# Patient Record
Sex: Female | Born: 1960 | Race: Black or African American | Hispanic: No | Marital: Married | Smoking: Never smoker
Health system: Southern US, Community
[De-identification: ages and names within clinical notes are randomized; demographics above are authoritative.]

## PROBLEM LIST (undated history)

## (undated) DIAGNOSIS — E876 Hypokalemia: Secondary | ICD-10-CM

## (undated) DIAGNOSIS — I10 Essential (primary) hypertension: Secondary | ICD-10-CM

## (undated) HISTORY — PX: ABDOMINAL HYSTERECTOMY: SHX81

## (undated) HISTORY — PX: TUBAL LIGATION: SHX77

## (undated) HISTORY — PX: APPENDECTOMY: SHX54

---

## 1998-08-20 DIAGNOSIS — I10 Essential (primary) hypertension: Secondary | ICD-10-CM

## 1998-08-20 HISTORY — DX: Essential (primary) hypertension: I10

## 2010-03-17 ENCOUNTER — Ambulatory Visit: Payer: Self-pay | Admitting: Diagnostic Radiology

## 2010-03-17 ENCOUNTER — Ambulatory Visit (HOSPITAL_BASED_OUTPATIENT_CLINIC_OR_DEPARTMENT_OTHER): Admission: RE | Admit: 2010-03-17 | Discharge: 2010-03-17 | Payer: Self-pay | Admitting: Unknown Physician Specialty

## 2011-05-21 ENCOUNTER — Emergency Department (HOSPITAL_BASED_OUTPATIENT_CLINIC_OR_DEPARTMENT_OTHER)
Admission: EM | Admit: 2011-05-21 | Discharge: 2011-05-21 | Disposition: A | Discharge status: Home / Self Care | Payer: Self-pay | Attending: Emergency Medicine | Admitting: Emergency Medicine

## 2011-05-21 DIAGNOSIS — E119 Type 2 diabetes mellitus without complications: Secondary | ICD-10-CM | POA: Insufficient documentation

## 2011-05-21 DIAGNOSIS — M25562 Pain in left knee: Secondary | ICD-10-CM

## 2011-05-21 DIAGNOSIS — M25569 Pain in unspecified knee: Secondary | ICD-10-CM | POA: Insufficient documentation

## 2011-05-21 DIAGNOSIS — I10 Essential (primary) hypertension: Secondary | ICD-10-CM | POA: Insufficient documentation

## 2011-05-21 HISTORY — DX: Essential (primary) hypertension: I10

## 2011-05-21 MED ORDER — HYDROCODONE-ACETAMINOPHEN 5-325 MG PO TABS: 2.0000 | ORAL_TABLET | ORAL | Status: AC | PRN

## 2011-05-21 MED ORDER — HYDROCODONE-ACETAMINOPHEN 5-325 MG PO TABS
2.0000 | ORAL_TABLET | Freq: Once | ORAL | Status: AC
  Administered 2011-05-21: 2 via ORAL
  Filled 2011-05-21: qty 2

## 2011-05-21 NOTE — ED Notes (Signed)
C/O anterior left knee pain

## 2011-05-21 NOTE — ED Provider Notes (Addendum)
History     CSN: [id number] Arrival date & time: 05/21/2011  9:56 AM  Chief Complaint  Patient presents with  . Knee Pain    (Consider location/radiation/quality/duration/timing/severity/associated sxs/prior treatment) HPI Complains of left knee pain anterior nonradiating onset 2 days ago quality sharp worse with movement improved with lying still no fever no injury no other associated symptoms no treatment prior to coming here no other complaint Past Medical History  Diagnosis Date  . Hypertension   . Diabetes mellitus    Arthritis Past Surgical History  Procedure Date  . Tubal ligation   . Appendectomy   . Cesarean section     History reviewed. No pertinent family history.  History  Substance Use Topics  . Smoking status: Never Smoker   . Smokeless tobacco: Never Used  . Alcohol Use: No    OB History    Grav Para Term Preterm Abortions TAB SAB Ect Mult Living                  Review of Systems  Constitutional: Negative.   Cardiovascular: Negative.   Gastrointestinal: Negative.   Musculoskeletal: Positive for arthralgias.  Skin: Negative.   Neurological: Negative.   Hematological: Negative.   Psychiatric/Behavioral: Negative.     Allergies  Anaprox  Home Medications   Current Outpatient Rx  Name Route Sig Dispense Refill  . HYDROCHLOROTHIAZIDE 25 MG PO TABS Oral Take 25 mg by mouth daily.      Marland Kitchen LISINOPRIL 20 MG PO TABS Oral Take 20 mg by mouth daily.      Marland Kitchen METFORMIN HCL 500 MG (MOD) PO TB24 Oral Take 500 mg by mouth daily with breakfast.        BP 173/100  Pulse 84  Temp(Src) 99.3 F (37.4 C) (Oral)  Resp 16  Ht 5\' 4"  (1.626 m)  Wt 190 lb (86.183 kg)  BMI 32.61 kg/m2  SpO2 98%  Physical Exam  Nursing note and vitals reviewed. Constitutional: She appears well-developed and well-nourished.  HENT:  Head: Normocephalic and atraumatic.  Eyes: Conjunctivae are normal. Pupils are equal, round, and reactive to light.  Neck: Neck supple. No  tracheal deviation present. No thyromegaly present.  Cardiovascular: Normal rate and regular rhythm.   No murmur heard. Pulmonary/Chest: Effort normal and breath sounds normal.  Abdominal:       Obese  Musculoskeletal: Normal range of motion. She exhibits no edema and no tenderness.       Left lower extremity no redness no swelling no point tenderness no joint effusion and knee DP pulse 2+ good capillary refill  Neurological: She is alert. Coordination normal.       Walks with slight limp favoring left lower extremity  Skin: Skin is warm and dry. No rash noted. No erythema.  Psychiatric: She has a normal mood and affect.    ED Course  Procedures (including critical care time)  Labs Reviewed - No data to display No results found.   No diagnosis found.    MDM  No further workup needed patient reports she took her blood glucose this morning which was 110. X-rays not indicated there is no signs of infection on physical exam Knee pain most likely secondary to arthritic condition Plan prescription Norco followup community clinic as needed Diagnosis pain in left knee        Doug Sou, MD 05/21/11 1046  Doug Sou, MD 05/21/11 1047

## 2011-06-13 ENCOUNTER — Emergency Department (INDEPENDENT_AMBULATORY_CARE_PROVIDER_SITE_OTHER): Payer: Self-pay

## 2011-06-13 ENCOUNTER — Encounter (HOSPITAL_BASED_OUTPATIENT_CLINIC_OR_DEPARTMENT_OTHER): Payer: Self-pay

## 2011-06-13 ENCOUNTER — Emergency Department (HOSPITAL_BASED_OUTPATIENT_CLINIC_OR_DEPARTMENT_OTHER)
Admission: EM | Admit: 2011-06-13 | Discharge: 2011-06-13 | Disposition: A | Discharge status: Home / Self Care | Payer: Self-pay | Attending: Emergency Medicine | Admitting: Emergency Medicine

## 2011-06-13 DIAGNOSIS — E119 Type 2 diabetes mellitus without complications: Secondary | ICD-10-CM | POA: Insufficient documentation

## 2011-06-13 DIAGNOSIS — J069 Acute upper respiratory infection, unspecified: Secondary | ICD-10-CM | POA: Insufficient documentation

## 2011-06-13 DIAGNOSIS — I10 Essential (primary) hypertension: Secondary | ICD-10-CM | POA: Insufficient documentation

## 2011-06-13 DIAGNOSIS — R0989 Other specified symptoms and signs involving the circulatory and respiratory systems: Secondary | ICD-10-CM

## 2011-06-13 DIAGNOSIS — R05 Cough: Secondary | ICD-10-CM

## 2011-06-13 NOTE — ED Notes (Signed)
Nasal congestion, cough

## 2011-06-13 NOTE — ED Provider Notes (Signed)
History     CSN: [id number] Arrival date & time: 06/13/2011  6:48 PM   First MD Initiated Contact with Patient 06/13/11 1857      Chief Complaint  Patient presents with  . URI    (Consider location/radiation/quality/duration/timing/severity/associated sxs/prior treatment) HPI Comments: Pt state that she has been taking some cold medicine otc without much relief  Patient is a 50 y.o. female presenting with URI. The history is provided by the patient. No language interpreter was used.  URI The primary symptoms include cough. Primary symptoms do not include fever, sore throat, abdominal pain, nausea or vomiting. The current episode started 2 days ago. This is a new problem. The problem has not changed since onset. Symptoms associated with the illness include congestion.    Past Medical History  Diagnosis Date  . Hypertension   . Diabetes mellitus     Past Surgical History  Procedure Date  . Tubal ligation   . Appendectomy   . Cesarean section     No family history on file.  History  Substance Use Topics  . Smoking status: Never Smoker   . Smokeless tobacco: Never Used  . Alcohol Use: No    OB History    Grav Para Term Preterm Abortions TAB SAB Ect Mult Living                  Review of Systems  Constitutional: Negative for fever.  HENT: Positive for congestion. Negative for sore throat.   Respiratory: Positive for cough.   Gastrointestinal: Negative for nausea, vomiting and abdominal pain.  All other systems reviewed and are negative.    Allergies  Anaprox  Home Medications   Current Outpatient Rx  Name Route Sig Dispense Refill  . CHLORPHENIRAMINE-ACETAMINOPHEN 2-325 MG PO TABS Oral Take 2 tablets by mouth every 6 (six) hours as needed. For congestion     . HYDROCHLOROTHIAZIDE 25 MG PO TABS Oral Take 25 mg by mouth daily.      Marland Kitchen CLEAR EYES FOR DRY EYES PLUS OP Both Eyes Place 1 drop into both eyes daily as needed. For dry eyes     . LISINOPRIL 20 MG  PO TABS Oral Take 20 mg by mouth daily.      Marland Kitchen METFORMIN HCL ER (MOD) 500 MG PO TB24 Oral Take 500 mg by mouth daily with breakfast.      . POTASSIUM CHLORIDE CR 10 MEQ PO TBCR Oral Take 10 mEq by mouth 2 (two) times daily.        BP 165/76  Temp(Src) 99 F (37.2 C) (Oral)  Resp 20  Ht 5\' 4"  (1.626 m)  Wt 190 lb (86.183 kg)  BMI 32.61 kg/m2  SpO2 97%  Physical Exam  Nursing note and vitals reviewed. Constitutional: She is oriented to person, place, and time. She appears well-developed and well-nourished.  HENT:  Right Ear: External ear normal.  Left Ear: External ear normal.  Nose: Rhinorrhea present.  Mouth/Throat: Posterior oropharyngeal erythema present.  Eyes: Pupils are equal, round, and reactive to light.  Neck: Normal range of motion.  Cardiovascular: Normal rate and regular rhythm.   Pulmonary/Chest: Effort normal and breath sounds normal.  Abdominal: Soft.  Neurological: She is alert and oriented to person, place, and time.  Skin: Skin is warm and dry.  Psychiatric: She has a normal mood and affect.    ED Course  Procedures (including critical care time)  Labs Reviewed - No data to display Dg Chest 2 View  06/13/2011  *RADIOLOGY REPORT*  Clinical Data: Cough.  Congestion.  Upper respiratory symptoms.  CHEST - 2 VIEW  Comparison: None.  Findings: Lungs clear.  Cardiopericardial silhouette within normal limits.  Trachea midline.  No airspace disease or effusion.  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  IMPRESSION: No active cardiopulmonary disease.  Original Report Authenticated By: Andreas Newport, M.D.     1. URI (upper respiratory infection)       MDM  Pt is okay to go home and use otc medications        Teressa Lower, NP 06/13/11 1938

## 2011-06-14 NOTE — ED Provider Notes (Signed)
Medical screening examination/treatment/procedure(s) were performed by non-physician practitioner and as supervising physician I was immediately available for consultation/collaboration.   Jessen Siegman A. Patrica Duel, MD 06/14/11 0002

## 2011-10-27 ENCOUNTER — Encounter (HOSPITAL_BASED_OUTPATIENT_CLINIC_OR_DEPARTMENT_OTHER): Payer: Self-pay | Admitting: *Deleted

## 2011-10-27 ENCOUNTER — Emergency Department (HOSPITAL_BASED_OUTPATIENT_CLINIC_OR_DEPARTMENT_OTHER)
Admission: EM | Admit: 2011-10-27 | Discharge: 2011-10-27 | Disposition: A | Discharge status: Home / Self Care | Payer: Self-pay | Attending: Emergency Medicine | Admitting: Emergency Medicine

## 2011-10-27 DIAGNOSIS — H00019 Hordeolum externum unspecified eye, unspecified eyelid: Secondary | ICD-10-CM | POA: Insufficient documentation

## 2011-10-27 DIAGNOSIS — E119 Type 2 diabetes mellitus without complications: Secondary | ICD-10-CM | POA: Insufficient documentation

## 2011-10-27 DIAGNOSIS — I10 Essential (primary) hypertension: Secondary | ICD-10-CM | POA: Insufficient documentation

## 2011-10-27 NOTE — ED Provider Notes (Signed)
History     CSN: [id number]  Arrival date & time 10/27/11  1407   First MD Initiated Contact with Patient 10/27/11 1658      Chief Complaint  Patient presents with  . Eye Pain    (Consider location/radiation/quality/duration/timing/severity/associated sxs/prior treatment) HPI Comments: Pt states that she noticed a "stye" on her right lower eyelid:pt states that she had a cold recently  Patient is a 51 y.o. female presenting with eye pain. The history is provided by the patient. No language interpreter was used.  Eye Pain This is a new problem. The current episode started in the past 7 days. The problem occurs constantly. The problem has been unchanged. Pertinent negatives include no fever. The symptoms are aggravated by nothing. She has tried nothing for the symptoms.    Past Medical History  Diagnosis Date  . Hypertension   . Diabetes mellitus     Past Surgical History  Procedure Date  . Tubal ligation   . Appendectomy   . Cesarean section     History reviewed. No pertinent family history.  History  Substance Use Topics  . Smoking status: Never Smoker   . Smokeless tobacco: Never Used  . Alcohol Use: No    OB History    Grav Para Term Preterm Abortions TAB SAB Ect Mult Living                  Review of Systems  Constitutional: Negative.  Negative for fever.  HENT: Negative.   Eyes: Positive for pain.  Respiratory: Negative.   Cardiovascular: Negative.   Neurological: Negative.     Allergies  Anaprox  Home Medications   Current Outpatient Rx  Name Route Sig Dispense Refill  . HYDROCHLOROTHIAZIDE 25 MG PO TABS Oral Take 25 mg by mouth daily.      Marland Kitchen LISINOPRIL 20 MG PO TABS Oral Take 20 mg by mouth daily.      Marland Kitchen METFORMIN HCL 500 MG PO TABS Oral Take 500 mg by mouth daily.    Marland Kitchen POTASSIUM CHLORIDE ER 10 MEQ PO TBCR Oral Take 10 mEq by mouth 2 (two) times daily.        BP 150/71  Pulse 94  Temp(Src) 98.4 F (36.9 C) (Oral)  Resp 20  Ht 5\' 4"   (1.626 m)  Wt 180 lb (81.647 kg)  BMI 30.90 kg/m2  SpO2 98%  Physical Exam  Nursing note and vitals reviewed. Constitutional: She appears well-developed and well-nourished.  HENT:  Right Ear: External ear normal.  Left Ear: External ear normal.  Eyes: Conjunctivae and EOM are normal. Pupils are equal, round, and reactive to light. Right eye exhibits hordeolum.  Cardiovascular: Normal rate and regular rhythm.   Pulmonary/Chest: Effort normal and breath sounds normal.  Musculoskeletal: Normal range of motion.    ED Course  Procedures (including critical care time)  Labs Reviewed - No data to display No results found.   1. Stye       MDM  Discussed warm compress treatment with pt        Teressa Lower, NP 10/27/11 1734

## 2011-10-27 NOTE — ED Notes (Signed)
Pt states she thinks she has a stye on her right lower eyelid x 3 days.

## 2011-10-27 NOTE — ED Provider Notes (Signed)
Medical screening examination/treatment/procedure(s) were performed by non-physician practitioner and as supervising physician I was immediately available for consultation/collaboration.   Arles Rumbold A Antrice Pal, MD 10/27/11 2318 

## 2011-10-27 NOTE — Discharge Instructions (Signed)
Sty  A sty (hordeolum) is an infection of a gland in the eyelid located at the base of the eyelash. A sty may develop a white or yellow head of pus. It can be puffy (swollen). Usually, the sty will burst and pus will come out on its own. They do not leave lumps in the eyelid once they drain.  A sty is often confused with another form of cyst of the eyelid called a chalazion. Chalazions occur within the eyelid and not on the edge where the bases of the eyelashes are. They often are red, sore and then form firm lumps in the eyelid.  CAUSES    Germs (bacteria).   Lasting (chronic) eyelid inflammation.  SYMPTOMS    Tenderness, redness and swelling along the edge of the eyelid at the base of the eyelashes.   Sometimes, there is a white or yellow head of pus. It may or may not drain.  DIAGNOSIS   An ophthalmologist will be able to distinguish between a sty and a chalazion and treat the condition appropriately.   TREATMENT    Styes are typically treated with warm packs (compresses) until drainage occurs.   In rare cases, medicines that kill germs (antibiotics) may be prescribed. These antibiotics may be in the form of drops, cream or pills.   If a hard lump has formed, it is generally necessary to do a small incision and remove the hardened contents of the cyst in a minor surgical procedure done in the office.   In suspicious cases, your caregiver may send the contents of the cyst to the lab to be certain that it is not a rare, but dangerous form of cancer of the glands of the eyelid.  HOME CARE INSTRUCTIONS    Wash your hands often and dry them with a clean towel. Avoid touching your eyelid. This may spread the infection to other parts of the eye.   Apply heat to your eyelid for 10 to 20 minutes, several times a day, to ease pain and help to heal it faster.   Do not squeeze the sty. Allow it to drain on its own. Wash your eyelid carefully 3 to 4 times per day to remove any pus.  SEEK IMMEDIATE MEDICAL CARE IF:     Your eye becomes painful or puffy (swollen).   Your vision changes.   Your sty does not drain by itself within 3 days.   Your sty comes back within a short period of time, even with treatment.   You have redness (inflammation) around the eye.   You have a fever.  Document Released: 05/16/2005 Document Revised: 07/26/2011 Document Reviewed: 01/18/2009  [employer] Patient Information 2012 [employer]

## 2012-06-09 ENCOUNTER — Other Ambulatory Visit (HOSPITAL_BASED_OUTPATIENT_CLINIC_OR_DEPARTMENT_OTHER): Payer: Self-pay | Admitting: Unknown Physician Specialty

## 2012-06-09 DIAGNOSIS — Z1231 Encounter for screening mammogram for malignant neoplasm of breast: Secondary | ICD-10-CM

## 2012-06-11 ENCOUNTER — Inpatient Hospital Stay (HOSPITAL_BASED_OUTPATIENT_CLINIC_OR_DEPARTMENT_OTHER): Admission: RE | Admit: 2012-06-11 | Payer: Self-pay | Source: Ambulatory Visit

## 2012-06-27 ENCOUNTER — Other Ambulatory Visit (HOSPITAL_BASED_OUTPATIENT_CLINIC_OR_DEPARTMENT_OTHER): Payer: Self-pay | Admitting: Obstetrics and Gynecology

## 2012-06-27 ENCOUNTER — Ambulatory Visit (HOSPITAL_BASED_OUTPATIENT_CLINIC_OR_DEPARTMENT_OTHER)
Admission: RE | Admit: 2012-06-27 | Discharge: 2012-06-27 | Disposition: A | Discharge status: Home / Self Care | Payer: Self-pay | Source: Ambulatory Visit | Attending: Unknown Physician Specialty | Admitting: Unknown Physician Specialty

## 2012-06-27 DIAGNOSIS — Z1231 Encounter for screening mammogram for malignant neoplasm of breast: Secondary | ICD-10-CM

## 2012-06-27 DIAGNOSIS — R922 Inconclusive mammogram: Secondary | ICD-10-CM | POA: Insufficient documentation

## 2012-07-03 ENCOUNTER — Other Ambulatory Visit: Payer: Self-pay | Admitting: Obstetrics and Gynecology

## 2012-07-03 DIAGNOSIS — R928 Other abnormal and inconclusive findings on diagnostic imaging of breast: Secondary | ICD-10-CM

## 2013-02-27 ENCOUNTER — Emergency Department (HOSPITAL_BASED_OUTPATIENT_CLINIC_OR_DEPARTMENT_OTHER)
Admission: EM | Admit: 2013-02-27 | Discharge: 2013-02-27 | Disposition: A | Discharge status: Home / Self Care | Payer: Medicare Other | Attending: Emergency Medicine | Admitting: Emergency Medicine

## 2013-02-27 ENCOUNTER — Encounter (HOSPITAL_BASED_OUTPATIENT_CLINIC_OR_DEPARTMENT_OTHER): Payer: Self-pay | Admitting: Emergency Medicine

## 2013-02-27 DIAGNOSIS — I10 Essential (primary) hypertension: Secondary | ICD-10-CM | POA: Insufficient documentation

## 2013-02-27 DIAGNOSIS — Z79899 Other long term (current) drug therapy: Secondary | ICD-10-CM | POA: Insufficient documentation

## 2013-02-27 DIAGNOSIS — E119 Type 2 diabetes mellitus without complications: Secondary | ICD-10-CM | POA: Insufficient documentation

## 2013-02-27 LAB — URINALYSIS, ROUTINE W REFLEX MICROSCOPIC
Bilirubin Urine: NEGATIVE
Nitrite: NEGATIVE
Protein, ur: NEGATIVE mg/dL
Specific Gravity, Urine: 1.013 (ref 1.005–1.030)
pH: 7.5 (ref 5.0–8.0)

## 2013-02-27 LAB — URINE MICROSCOPIC-ADD ON

## 2013-02-27 MED ORDER — HYDROCHLOROTHIAZIDE 25 MG PO TABS: 25.0000 mg | ORAL_TABLET | Freq: Every day | ORAL | Status: DC

## 2013-02-27 MED ORDER — POTASSIUM CHLORIDE ER 10 MEQ PO TBCR: 10.0000 meq | EXTENDED_RELEASE_TABLET | Freq: Two times a day (BID) | ORAL | Status: DC

## 2013-02-27 MED ORDER — LISINOPRIL 20 MG PO TABS: 20.0000 mg | ORAL_TABLET | Freq: Every day | ORAL | Status: DC

## 2013-02-27 MED ORDER — LISINOPRIL 10 MG PO TABS
10.0000 mg | ORAL_TABLET | Freq: Once | ORAL | Status: AC
  Administered 2013-02-27: 10 mg via ORAL
  Filled 2013-02-27: qty 1

## 2013-02-27 NOTE — ED Provider Notes (Deleted)
8:58 PM  Date: 02/27/2013  Rate:112  Rhythm: sinus tachycardia  QRS Axis: normal  Intervals: normal  ST/T Wave abnormalities: normal  Conduction Disutrbances:none  Narrative Interpretation: Sinus tachycardia  Old EKG Reviewed: none available    Carleene Cooper III, MD 02/27/13 2059

## 2013-02-27 NOTE — ED Notes (Signed)
Pt ran out of blood pressure medications lisinopril medication a month ago, but pt had some hctz that she was taken instead, pt noticed h/a, sob and recurrent chest pain that started this week

## 2013-02-28 NOTE — ED Provider Notes (Signed)
History    CSN: [id number] Arrival date & time 02/27/13  2018  First MD Initiated Contact with Patient 02/27/13 2123     Chief Complaint  Patient presents with  . Hypertension   (Consider location/radiation/quality/duration/timing/severity/associated sxs/prior Treatment) Patient is a 52 y.o. female presenting with hypertension. The history is provided by the patient. No language interpreter was used.  Hypertension This is a chronic problem. The current episode started more than 1 week ago (Pt has been off BP medication for some time, was trying to control it with diet and exercise.  She has no primary care physician at present.). Episode frequency: She says her blood pressure was "sky high" toady, and says it was 158 over 90 something. The problem has been gradually worsening. Pertinent negatives include no chest pain, no abdominal pain, no headaches and no shortness of breath. Nothing relieves the symptoms. She has tried nothing for the symptoms.   Past Medical History  Diagnosis Date  . Hypertension   . Diabetes mellitus    Past Surgical History  Procedure Laterality Date  . Tubal ligation    . Appendectomy    . Cesarean section     History reviewed. No pertinent family history. History  Substance Use Topics  . Smoking status: Never Smoker   . Smokeless tobacco: Never Used  . Alcohol Use: No   OB History   Grav Para Term Preterm Abortions TAB SAB Ect Mult Living                 Review of Systems  Constitutional: Negative.   HENT: Negative.   Eyes: Negative.   Respiratory: Negative.  Negative for shortness of breath.   Cardiovascular: Negative for chest pain.       History of hypertension with treatment in the past.  Gastrointestinal: Negative.  Negative for abdominal pain.  Genitourinary: Negative.   Musculoskeletal: Negative.   Skin: Negative.   Neurological: Negative.  Negative for headaches.  Psychiatric/Behavioral: Negative.     Allergies   Anaprox  Home Medications   Current Outpatient Rx  Name  Route  Sig  Dispense  Refill  . hydrochlorothiazide (HYDRODIURIL) 25 MG tablet   Oral   Take 25 mg by mouth daily.           . metFORMIN (GLUCOPHAGE) 500 MG tablet   Oral   Take 500 mg by mouth daily.         . potassium chloride (K-DUR) 10 MEQ tablet   Oral   Take 10 mEq by mouth 2 (two) times daily.           . hydrochlorothiazide (HYDRODIURIL) 25 MG tablet   Oral   Take 1 tablet (25 mg total) by mouth daily.   30 tablet   0   . lisinopril (PRINIVIL,ZESTRIL) 20 MG tablet   Oral   Take 20 mg by mouth daily.           Marland Kitchen lisinopril (PRINIVIL,ZESTRIL) 20 MG tablet   Oral   Take 1 tablet (20 mg total) by mouth daily.   30 tablet   0   . potassium chloride (K-DUR) 10 MEQ tablet   Oral   Take 1 tablet (10 mEq total) by mouth 2 (two) times daily.   30 tablet   0    BP 147/80  Pulse 105  Temp(Src) 98.6 F (37 C) (Oral)  Resp 18  Ht 5\' 2"  (1.575 m)  Wt 207 lb 12.8 oz (94.257 kg)  BMI 38  kg/m2  SpO2 100% Physical Exam  Nursing note and vitals reviewed. Constitutional: She is oriented to person, place, and time. She appears well-developed and well-nourished.  BP 203/88.  HENT:  Head: Normocephalic and atraumatic.  Right Ear: External ear normal.  Left Ear: External ear normal.  Mouth/Throat: Oropharynx is clear and moist.  Eyes: Conjunctivae and EOM are normal. Pupils are equal, round, and reactive to light.  Neck: Normal range of motion. Neck supple.  Cardiovascular: Normal rate, regular rhythm and normal heart sounds.   Pulmonary/Chest: Effort normal and breath sounds normal.  Abdominal: Soft. Bowel sounds are normal.  Musculoskeletal: Normal range of motion. She exhibits no edema and no tenderness.  Neurological: She is alert and oriented to person, place, and time.  No sensory or motor deficit.  Skin: Skin is warm and dry.  Psychiatric: She has a normal mood and affect. Her behavior is  normal.    ED Course  Procedures (including critical care time)   Date: 02/27/2013  Rate:112  Rhythm: sinus tachycardia  QRS Axis: normal  Intervals: normal  ST/T Wave abnormalities: normal  Conduction Disutrbances:none  Narrative Interpretation: Sinus tachycardia  Old EKG Reviewed: none available  Results for orders placed during the hospital encounter of 02/27/13  URINALYSIS, ROUTINE W REFLEX MICROSCOPIC      Result Value Range   Color, Urine YELLOW  YELLOW   APPearance CLEAR  CLEAR   Specific Gravity, Urine 1.013  1.005 - 1.030   pH 7.5  5.0 - 8.0   Glucose, UA NEGATIVE  NEGATIVE mg/dL   Hgb urine dipstick NEGATIVE  NEGATIVE   Bilirubin Urine NEGATIVE  NEGATIVE   Ketones, ur NEGATIVE  NEGATIVE mg/dL   Protein, ur NEGATIVE  NEGATIVE mg/dL   Urobilinogen, UA 1.0  0.0 - 1.0 mg/dL   Nitrite NEGATIVE  NEGATIVE   Leukocytes, UA MODERATE (*) NEGATIVE  URINE MICROSCOPIC-ADD ON      Result Value Range   Squamous Epithelial / LPF FEW (*) RARE   WBC, UA 7-10  <3 WBC/hpf   Bacteria, UA FEW (*) RARE   Rx HCTZ, Lisinopril, potassium chloride.  Referred to Dr. Abner Greenspan for continued treatment of her hypertension.   1. Hypertension        Carleene Cooper III, MD 02/28/13 1355

## 2013-03-01 LAB — URINE CULTURE

## 2013-04-04 ENCOUNTER — Emergency Department (HOSPITAL_BASED_OUTPATIENT_CLINIC_OR_DEPARTMENT_OTHER)
Admission: EM | Admit: 2013-04-04 | Discharge: 2013-04-04 | Disposition: A | Discharge status: Home / Self Care | Payer: Medicare Other | Attending: Emergency Medicine | Admitting: Emergency Medicine

## 2013-04-04 ENCOUNTER — Encounter (HOSPITAL_BASED_OUTPATIENT_CLINIC_OR_DEPARTMENT_OTHER): Payer: Self-pay

## 2013-04-04 DIAGNOSIS — I10 Essential (primary) hypertension: Secondary | ICD-10-CM

## 2013-04-04 DIAGNOSIS — E876 Hypokalemia: Secondary | ICD-10-CM | POA: Insufficient documentation

## 2013-04-04 DIAGNOSIS — Z79899 Other long term (current) drug therapy: Secondary | ICD-10-CM | POA: Insufficient documentation

## 2013-04-04 DIAGNOSIS — E119 Type 2 diabetes mellitus without complications: Secondary | ICD-10-CM | POA: Insufficient documentation

## 2013-04-04 DIAGNOSIS — Z760 Encounter for issue of repeat prescription: Secondary | ICD-10-CM | POA: Insufficient documentation

## 2013-04-04 LAB — BASIC METABOLIC PANEL
BUN: 6 mg/dL (ref 6–23)
Chloride: 98 mEq/L (ref 96–112)
Creatinine, Ser: 0.7 mg/dL (ref 0.50–1.10)
GFR calc Af Amer: >90 mL/min (ref >90)
GFR calc non Af Amer: >90 mL/min (ref >90)
Glucose, Bld: 206 mg/dL — ABNORMAL HIGH (ref 70–99)
Potassium: 2.7 mEq/L — CL (ref 3.5–5.1)
Sodium: 136 mEq/L (ref 135–145)

## 2013-04-04 LAB — CBC WITH DIFFERENTIAL/PLATELET
Basophils Relative: 0 % (ref 0–1)
Eosinophils Absolute: 0 10*3/uL (ref 0.0–0.7)
Eosinophils Relative: 0 % (ref 0–5)
Hemoglobin: 13.4 g/dL (ref 12.0–15.0)
Lymphocytes Relative: 26 % (ref 12–46)
Lymphs Abs: 1.1 10*3/uL (ref 0.7–4.0)
MCH: 28.8 pg (ref 26.0–34.0)
MCHC: 35.3 g/dL (ref 30.0–36.0)
MCV: 81.7 fL (ref 78.0–100.0)
Neutrophils Relative %: 59 % (ref 43–77)

## 2013-04-04 MED ORDER — POTASSIUM CHLORIDE ER 10 MEQ PO TBCR: 20.0000 meq | EXTENDED_RELEASE_TABLET | Freq: Two times a day (BID) | ORAL | Status: DC

## 2013-04-04 MED ORDER — POTASSIUM CHLORIDE CRYS ER 20 MEQ PO TBCR
40.0000 meq | EXTENDED_RELEASE_TABLET | Freq: Once | ORAL | Status: AC
  Administered 2013-04-04: 40 meq via ORAL
  Filled 2013-04-04: qty 2

## 2013-04-04 MED ORDER — SODIUM CHLORIDE 0.9 % IV BOLUS (SEPSIS)
1000.0000 mL | Freq: Once | INTRAVENOUS | Status: AC
  Administered 2013-04-04: 1000 mL via INTRAVENOUS

## 2013-04-04 MED ORDER — HYDROCHLOROTHIAZIDE 25 MG PO TABS: 25.0000 mg | ORAL_TABLET | Freq: Every day | ORAL | Status: DC

## 2013-04-04 MED ORDER — LISINOPRIL 20 MG PO TABS: 20.0000 mg | ORAL_TABLET | Freq: Every day | ORAL | Status: DC

## 2013-04-04 MED ORDER — POTASSIUM CHLORIDE 10 MEQ/100ML IV SOLN
10.0000 meq | INTRAVENOUS | Status: AC
  Administered 2013-04-04 (×3): 10 meq via INTRAVENOUS
  Filled 2013-04-04: qty 100
  Filled 2013-04-04: qty 200

## 2013-04-04 NOTE — ED Notes (Signed)
Pending for potassium IV to finish

## 2013-04-04 NOTE — ED Notes (Signed)
Pt reports she is here for blood pressure medication refill. Has an appointment with PMD but doesn't want to run out of medication.

## 2013-04-04 NOTE — ED Provider Notes (Addendum)
CSN: [id number]     Arrival date & time 04/04/13  0915 History     First MD Initiated Contact with Patient 04/04/13 [phone]     Chief Complaint  Patient presents with  . Medication Refill   (Consider location/radiation/quality/duration/timing/severity/associated sxs/prior Treatment) The history is provided by the patient.  Nancy Warner is a 52 y.o. female history of hypertension, diabetes here presenting with medication refill. She took her blood pressure medication this morning but is currently out of her lisinopril and hydrochlorothiazide. She has a primary care doctor appointment 2 weeks unable to get an earlier appointment. Denies any headache or chest pain or abdominal pain or trouble urinating or fever. She states that she still has her metformin.   Past Medical History  Diagnosis Date  . Hypertension   . Diabetes mellitus    Past Surgical History  Procedure Laterality Date  . Tubal ligation    . Appendectomy    . Cesarean section     No family history on file. History  Substance Use Topics  . Smoking status: Never Smoker   . Smokeless tobacco: Never Used  . Alcohol Use: No   OB History   Grav Para Term Preterm Abortions TAB SAB Ect Mult Living                 Review of Systems  Constitutional: Negative for fever.  Cardiovascular: Negative for chest pain.  All other systems reviewed and are negative.    Allergies  Anaprox  Home Medications   Current Outpatient Rx  Name  Route  Sig  Dispense  Refill  . hydrochlorothiazide (HYDRODIURIL) 25 MG tablet   Oral   Take 25 mg by mouth daily.           . hydrochlorothiazide (HYDRODIURIL) 25 MG tablet   Oral   Take 1 tablet (25 mg total) by mouth daily.   30 tablet   0   . lisinopril (PRINIVIL,ZESTRIL) 20 MG tablet   Oral   Take 20 mg by mouth daily.           Marland Kitchen lisinopril (PRINIVIL,ZESTRIL) 20 MG tablet   Oral   Take 1 tablet (20 mg total) by mouth daily.   30 tablet   0   . metFORMIN  (GLUCOPHAGE) 500 MG tablet   Oral   Take 500 mg by mouth daily.         . potassium chloride (K-DUR) 10 MEQ tablet   Oral   Take 10 mEq by mouth 2 (two) times daily.           . potassium chloride (K-DUR) 10 MEQ tablet   Oral   Take 1 tablet (10 mEq total) by mouth 2 (two) times daily.   30 tablet   0    BP 168/86  Pulse 113  Temp(Src) 98.5 F (36.9 C) (Oral)  Resp 18  Ht 5\' 2"  (1.575 m)  Wt 212 lb (96.163 kg)  BMI 38.77 kg/m2  SpO2 98% Physical Exam  Nursing note and vitals reviewed. Constitutional: She is oriented to person, place, and time. She appears well-developed and well-nourished.  Calm, NAD   HENT:  Head: Normocephalic.  Mouth/Throat: Oropharynx is clear and moist.  Eyes: Pupils are equal, round, and reactive to light.  Neck: Normal range of motion.  Cardiovascular: Normal rate, regular rhythm and normal heart sounds.   Pulmonary/Chest: Effort normal and breath sounds normal. No respiratory distress. She has no wheezes. She has no rales.  Abdominal:  Soft. Bowel sounds are normal. She exhibits no distension. There is no tenderness. There is no rebound.  Musculoskeletal: Normal range of motion.  Neurological: She is alert and oriented to person, place, and time.  Skin: Skin is warm and dry.  Psychiatric: She has a normal mood and affect. Her behavior is normal. Judgment and thought content normal.    ED Course   Procedures (including critical care time)  Labs Reviewed  BASIC METABOLIC PANEL - Abnormal; Notable for the following:    Potassium 2.7 (*)    Glucose, Bld 213 (*)    All other components within normal limits  CBC WITH DIFFERENTIAL - Abnormal; Notable for the following:    Platelets 75 (*)    Monocytes Relative 15 (*)    All other components within normal limits  BASIC METABOLIC PANEL - Abnormal; Notable for the following:    Potassium 2.6 (*)    Glucose, Bld 206 (*)    All other components within normal limits   No results found. No  diagnosis found.  MDM  Nancy Warner is a 52 y.o. female here with medication refill. She is hypertensive on arrival but just took her BP meds. No signs of hypertensive urgency. She is on Kdur at home so will check BMP.    12:54 PM BMP showed K 2.7, repeat was 2.6. Loaded with K dur 40 meq and kcl 10 meq x 3. She said that she hasn't been compliant with her potassium. Will inc potassium to 20 meq BID. She has appointment in a week and recommend recheck BMP. Her tachycardia improved with K supplementation and I doubt PE or dehydration. BP improved in the ED. Will refill her BP meds as well.   Richardean Canal, MD 04/04/13 1256  Richardean Canal, MD 04/04/13 1257

## 2013-04-14 ENCOUNTER — Ambulatory Visit: Payer: Medicare Other

## 2013-04-16 ENCOUNTER — Ambulatory Visit: Payer: Medicare Other | Attending: Internal Medicine | Admitting: Internal Medicine

## 2013-04-16 VITALS — BP 173/107 | HR 100 | Temp 99.0°F | Resp 17 | Wt 205.8 lb

## 2013-04-16 DIAGNOSIS — E1110 Type 2 diabetes mellitus with ketoacidosis without coma: Secondary | ICD-10-CM

## 2013-04-16 DIAGNOSIS — E119 Type 2 diabetes mellitus without complications: Secondary | ICD-10-CM | POA: Insufficient documentation

## 2013-04-16 DIAGNOSIS — E1310 Other specified diabetes mellitus with ketoacidosis without coma: Secondary | ICD-10-CM

## 2013-04-16 DIAGNOSIS — E876 Hypokalemia: Secondary | ICD-10-CM | POA: Insufficient documentation

## 2013-04-16 DIAGNOSIS — I10 Essential (primary) hypertension: Secondary | ICD-10-CM | POA: Insufficient documentation

## 2013-04-16 LAB — BASIC METABOLIC PANEL
CO2: 26 mEq/L (ref 19–32)
Glucose, Bld: 202 mg/dL — ABNORMAL HIGH (ref 70–99)
Potassium: 2.8 mEq/L — ABNORMAL LOW (ref 3.5–5.3)
Sodium: 136 mEq/L (ref 135–145)

## 2013-04-16 LAB — MAGNESIUM: Magnesium: 1.7 mg/dL (ref 1.5–2.5)

## 2013-04-16 LAB — GLUCOSE, POCT (MANUAL RESULT ENTRY): POC Glucose: 244 mg/dl — AB (ref 70–99)

## 2013-04-16 MED ORDER — LISINOPRIL 20 MG PO TABS: 20.0000 mg | ORAL_TABLET | Freq: Every day | ORAL | Status: DC

## 2013-04-16 MED ORDER — HYDROCHLOROTHIAZIDE 25 MG PO TABS: 25.0000 mg | ORAL_TABLET | Freq: Every day | ORAL | Status: DC

## 2013-04-16 MED ORDER — METFORMIN HCL 500 MG PO TABS: 500.0000 mg | ORAL_TABLET | Freq: Every day | ORAL | Status: DC

## 2013-04-16 MED ORDER — POTASSIUM CHLORIDE ER 10 MEQ PO TBCR: 10.0000 meq | EXTENDED_RELEASE_TABLET | Freq: Two times a day (BID) | ORAL | Status: DC

## 2013-04-16 NOTE — Progress Notes (Signed)
Patient ID: Nancy Warner, female   DOB: Oct 08, 1960, 52 y.o.   MRN: [id number]  CC: follow up   HPI: Pt is 52 yo female with diabetes and HTN, presents to clinica for follow up. She denies chest pain or shortness of breath, no specific abdominal or urinary concerns, no recent sicknesses or hospitalization, no other systemic concerns. She explains she checks blood pressure regularly and her numbers are usually < 140/90. She also monitors her CBG levels regularly and the numbers are < 250.   Allergies  Allergen Reactions  . Anaprox [Naproxen Sodium] Palpitations   Past Medical History  Diagnosis Date  . Hypertension   . Diabetes mellitus    Current Outpatient Prescriptions on File Prior to Visit  Medication Sig Dispense Refill  . hydrochlorothiazide (HYDRODIURIL) 25 MG tablet Take 25 mg by mouth daily.        . hydrochlorothiazide (HYDRODIURIL) 25 MG tablet Take 1 tablet (25 mg total) by mouth daily.  15 tablet  0  . lisinopril (PRINIVIL,ZESTRIL) 20 MG tablet Take 20 mg by mouth daily.        Marland Kitchen lisinopril (PRINIVIL,ZESTRIL) 20 MG tablet Take 1 tablet (20 mg total) by mouth daily.  15 tablet  0  . metFORMIN (GLUCOPHAGE) 500 MG tablet Take 500 mg by mouth daily.      . potassium chloride (K-DUR) 10 MEQ tablet Take 10 mEq by mouth 2 (two) times daily.        . potassium chloride (K-DUR) 10 MEQ tablet Take 2 tablets (20 mEq total) by mouth 2 (two) times daily.  20 tablet  0   No current facility-administered medications on file prior to visit.   No family history of heart diseases.  History   Social History  . Marital Status: Married    Spouse Name: N/A    Number of Children: N/A  . Years of Education: N/A   Occupational History  . Not on file.   Social History Main Topics  . Smoking status: Never Smoker   . Smokeless tobacco: Never Used  . Alcohol Use: No  . Drug Use: No  . Sexual Activity:    Other Topics Concern  . Not on file   Social History Narrative  . No narrative  on file    Review of Systems  Constitutional: Negative for fever, chills, diaphoresis, activity change, appetite change and fatigue.  HENT: Negative for ear pain, nosebleeds, congestion, facial swelling, rhinorrhea, neck pain, neck stiffness and ear discharge.   Eyes: Negative for pain, discharge, redness, itching and visual disturbance.  Respiratory: Negative for cough, choking, chest tightness, shortness of breath, wheezing and stridor.   Cardiovascular: Negative for chest pain, palpitations and leg swelling.  Gastrointestinal: Negative for abdominal distention.  Genitourinary: Negative for dysuria, urgency, frequency, hematuria, flank pain, decreased urine volume, difficulty urinating and dyspareunia.  Musculoskeletal: Negative for back pain, joint swelling, arthralgias and gait problem.  Neurological: Negative for dizziness, tremors, seizures, syncope, facial asymmetry, speech difficulty, weakness, light-headedness, numbness and headaches.  Hematological: Negative for adenopathy. Does not bruise/bleed easily.  Psychiatric/Behavioral: Negative for hallucinations, behavioral problems, confusion, dysphoric mood, decreased concentration and agitation.    Objective:   Filed Vitals:   04/16/13 1123  BP: 173/107  Pulse: 100  Temp: 99 F (37.2 C)  Resp: 17    Physical Exam  Constitutional: Appears well-developed and well-nourished. No distress.  CVS: RRR, S1/S2 +, no murmurs, no gallops, no carotid bruit.  Pulmonary: Effort and breath sounds normal, no  stridor, rhonchi, wheezes, rales.  Abdominal: Soft. BS +,  no distension, tenderness, rebound or guarding.  Musculoskeletal: Normal range of motion. No edema and no tenderness.    Lab Results  Component Value Date   WBC 4.3 04/04/2013   HGB 13.4 04/04/2013   HCT 38.0 04/04/2013   MCV 81.7 04/04/2013   PLT 75* 04/04/2013   Lab Results  Component Value Date   CREATININE 0.70 04/04/2013   BUN 6 04/04/2013   NA 136 04/04/2013   K 2.6*  04/04/2013   CL 98 04/04/2013   CO2 30 04/04/2013    No results found for this basename: HGBA1C   Lipid Panel  No results found for this basename: chol, trig, hdl, cholhdl, vldl, ldlcalc       Assessment and plan:   Hypertension - blood pressure is slightly above target range on today's examination. Since patient reports checking her blood pressure regularly and the numbers usually less than 140/90 we'll continue to monitor this. Patient was advised to keep checking her blood pressure regularly and the numbers are persistently higher than 140/90 she was advised to call his back a week and readjust her regimen as indicated. Will repeat electrolyte panel today due to recent hypokalemia on electrolyte panel. Patient reports taking potassium supplement. Hypokalemia - unclear etiology especially because patient is also on lisinopril which causes hyperkalemia. Patient is on supplements as noted above but will repeat electrolyte panel today and check magnesium level as well Diabetes mellitus type 2 - very well controlled on metformin, A1c 6.6. We'll continue metformin as per home medical regimen. Patient educated on feet examination daily to avoid the wounds.

## 2013-04-16 NOTE — Addendum Note (Signed)
Addended by: Dorothea Ogle on: 04/16/2013 12:04 PM   Modules accepted: Orders

## 2013-04-16 NOTE — Patient Instructions (Signed)

## 2013-04-16 NOTE — Progress Notes (Signed)
Patient here to establish care History of DM and HTN 

## 2013-04-17 ENCOUNTER — Other Ambulatory Visit: Payer: Self-pay | Admitting: Internal Medicine

## 2013-05-01 ENCOUNTER — Telehealth: Payer: Self-pay | Admitting: Emergency Medicine

## 2013-05-01 ENCOUNTER — Telehealth: Payer: Self-pay

## 2013-05-01 MED ORDER — POTASSIUM CHLORIDE CRYS ER 20 MEQ PO TBCR: EXTENDED_RELEASE_TABLET | ORAL | Status: DC

## 2013-05-01 NOTE — Telephone Encounter (Signed)
Message copied by Lestine Mount on Fri May 01, 2013  3:41 PM ------      Message from: Dorothea Ogle      Created: Fri Apr 17, 2013  9:47 AM       Merril Abbe, can we call pt to let her know her potassium is still low and she actually has to continue taking potassium 20 MEQ twice daily and if she can come back next week so that we can check her potassium level againa nd readjust the dose of K-dur based on the results.       Thank you so much       Iskra ------

## 2013-05-01 NOTE — Telephone Encounter (Signed)
Patient aware of labs Scheduled and appt for follow up

## 2013-05-01 NOTE — Telephone Encounter (Signed)
PT CALLED REGARDING NEW SCRIPT FOR K-DUR MEDICATION. PT WAS TAKING 10 MEQ AND TOLD TO INCREASE DOSAGE TO 20 MEQ BID. NEW SCRIPT SENT IN 20 MEQ BID. TO TAKE UNTIL F/U Monday WITH PROVIDER

## 2013-05-04 ENCOUNTER — Other Ambulatory Visit: Payer: Medicare Other

## 2013-05-04 ENCOUNTER — Telehealth: Payer: Self-pay | Admitting: Emergency Medicine

## 2013-05-04 NOTE — Telephone Encounter (Signed)
Spoke with pt for instructions on taking K dosage. Pt verbalizes understanding. Has appt for f/u 9/19

## 2013-05-06 ENCOUNTER — Telehealth: Payer: Self-pay | Admitting: Internal Medicine

## 2013-05-06 NOTE — Telephone Encounter (Signed)
Pt called in because they did not understand the directions for medication;

## 2013-05-08 ENCOUNTER — Ambulatory Visit: Payer: Medicare Other | Attending: Internal Medicine

## 2013-05-08 DIAGNOSIS — R7989 Other specified abnormal findings of blood chemistry: Secondary | ICD-10-CM | POA: Insufficient documentation

## 2013-05-08 LAB — COMPLETE METABOLIC PANEL WITH GFR
ALT: 52 U/L — ABNORMAL HIGH (ref 0–35)
AST: 79 U/L — ABNORMAL HIGH (ref 0–37)
Alkaline Phosphatase: 134 U/L — ABNORMAL HIGH (ref 39–117)
BUN: 7 mg/dL (ref 6–23)
Creat: 0.69 mg/dL (ref 0.50–1.10)
Potassium: 3.3 mEq/L — ABNORMAL LOW (ref 3.5–5.3)

## 2013-05-11 ENCOUNTER — Telehealth: Payer: Self-pay

## 2013-05-11 NOTE — Telephone Encounter (Signed)
Message copied by Lestine Mount on Mon May 11, 2013 11:39 AM ------      Message from: Dorothea Ogle      Created: Fri May 08, 2013 10:49 PM       Can we please let pt know she needs to continue taking K-dur as prescribed. Thank you so much! ------

## 2013-05-11 NOTE — Telephone Encounter (Signed)
Left message on machine to return our call. 

## 2013-05-11 NOTE — Telephone Encounter (Signed)
Pt called back to get results of test;

## 2013-05-26 ENCOUNTER — Telehealth: Payer: Self-pay | Admitting: Emergency Medicine

## 2013-05-26 NOTE — Telephone Encounter (Signed)
Test results given. Pt scheduled for lab visit 05/29/13

## 2013-05-26 NOTE — Telephone Encounter (Signed)
Pt requesting K-dur refill. Scheduled an appt for pt to come in 05/29/13 for repeat BMP first

## 2013-05-29 ENCOUNTER — Ambulatory Visit: Payer: Medicare Other | Attending: Internal Medicine

## 2013-05-29 VITALS — BP 107/71 | HR 81 | Temp 98.4°F | Resp 16

## 2013-05-29 DIAGNOSIS — Z23 Encounter for immunization: Secondary | ICD-10-CM

## 2013-05-29 DIAGNOSIS — R7989 Other specified abnormal findings of blood chemistry: Secondary | ICD-10-CM

## 2013-05-29 LAB — BASIC METABOLIC PANEL
CO2: 33 mEq/L — ABNORMAL HIGH (ref 19–32)
Glucose, Bld: 157 mg/dL — ABNORMAL HIGH (ref 70–99)
Potassium: 3.2 mEq/L — ABNORMAL LOW (ref 3.5–5.3)
Sodium: 139 mEq/L (ref 135–145)

## 2013-06-02 ENCOUNTER — Telehealth: Payer: Self-pay | Admitting: Emergency Medicine

## 2013-06-02 MED ORDER — POTASSIUM CHLORIDE ER 10 MEQ PO TBCR: 10.0000 meq | EXTENDED_RELEASE_TABLET | Freq: Two times a day (BID) | ORAL | Status: DC

## 2013-06-02 NOTE — Telephone Encounter (Signed)
Message copied by Darlis Loan on Tue Jun 02, 2013  4:08 PM ------      Message from: Quentin Angst      Created: Tue Jun 02, 2013  3:17 PM       Please let patient know that her potassium is still low at 3.2, and we have eprescribed potassium tablets to [facility] pharmacy on [address] ------

## 2013-06-02 NOTE — Telephone Encounter (Signed)
Message copied by Darlis Loan on Tue Jun 02, 2013  4:04 PM ------      Message from: Quentin Angst      Created: Tue Jun 02, 2013  3:17 PM       Please let patient know that her potassium is still low at 3.2, and we have eprescribed potassium tablets to [facility] pharmacy on [address] ------

## 2013-06-02 NOTE — Telephone Encounter (Signed)
Pt given lab results with new script for potassium

## 2013-06-03 ENCOUNTER — Other Ambulatory Visit: Payer: Self-pay | Admitting: Emergency Medicine

## 2013-06-03 MED ORDER — POTASSIUM CHLORIDE CRYS ER 20 MEQ PO TBCR: EXTENDED_RELEASE_TABLET | ORAL | Status: DC

## 2013-06-09 IMAGING — CR DG CHEST 2V
2 series · 2 of 2 positions shown · non-contrast
Comparison: None.

CLINICAL DATA: Cough.  Congestion.  Upper respiratory symptoms.

CHEST - 2 VIEW

[w chest pa]
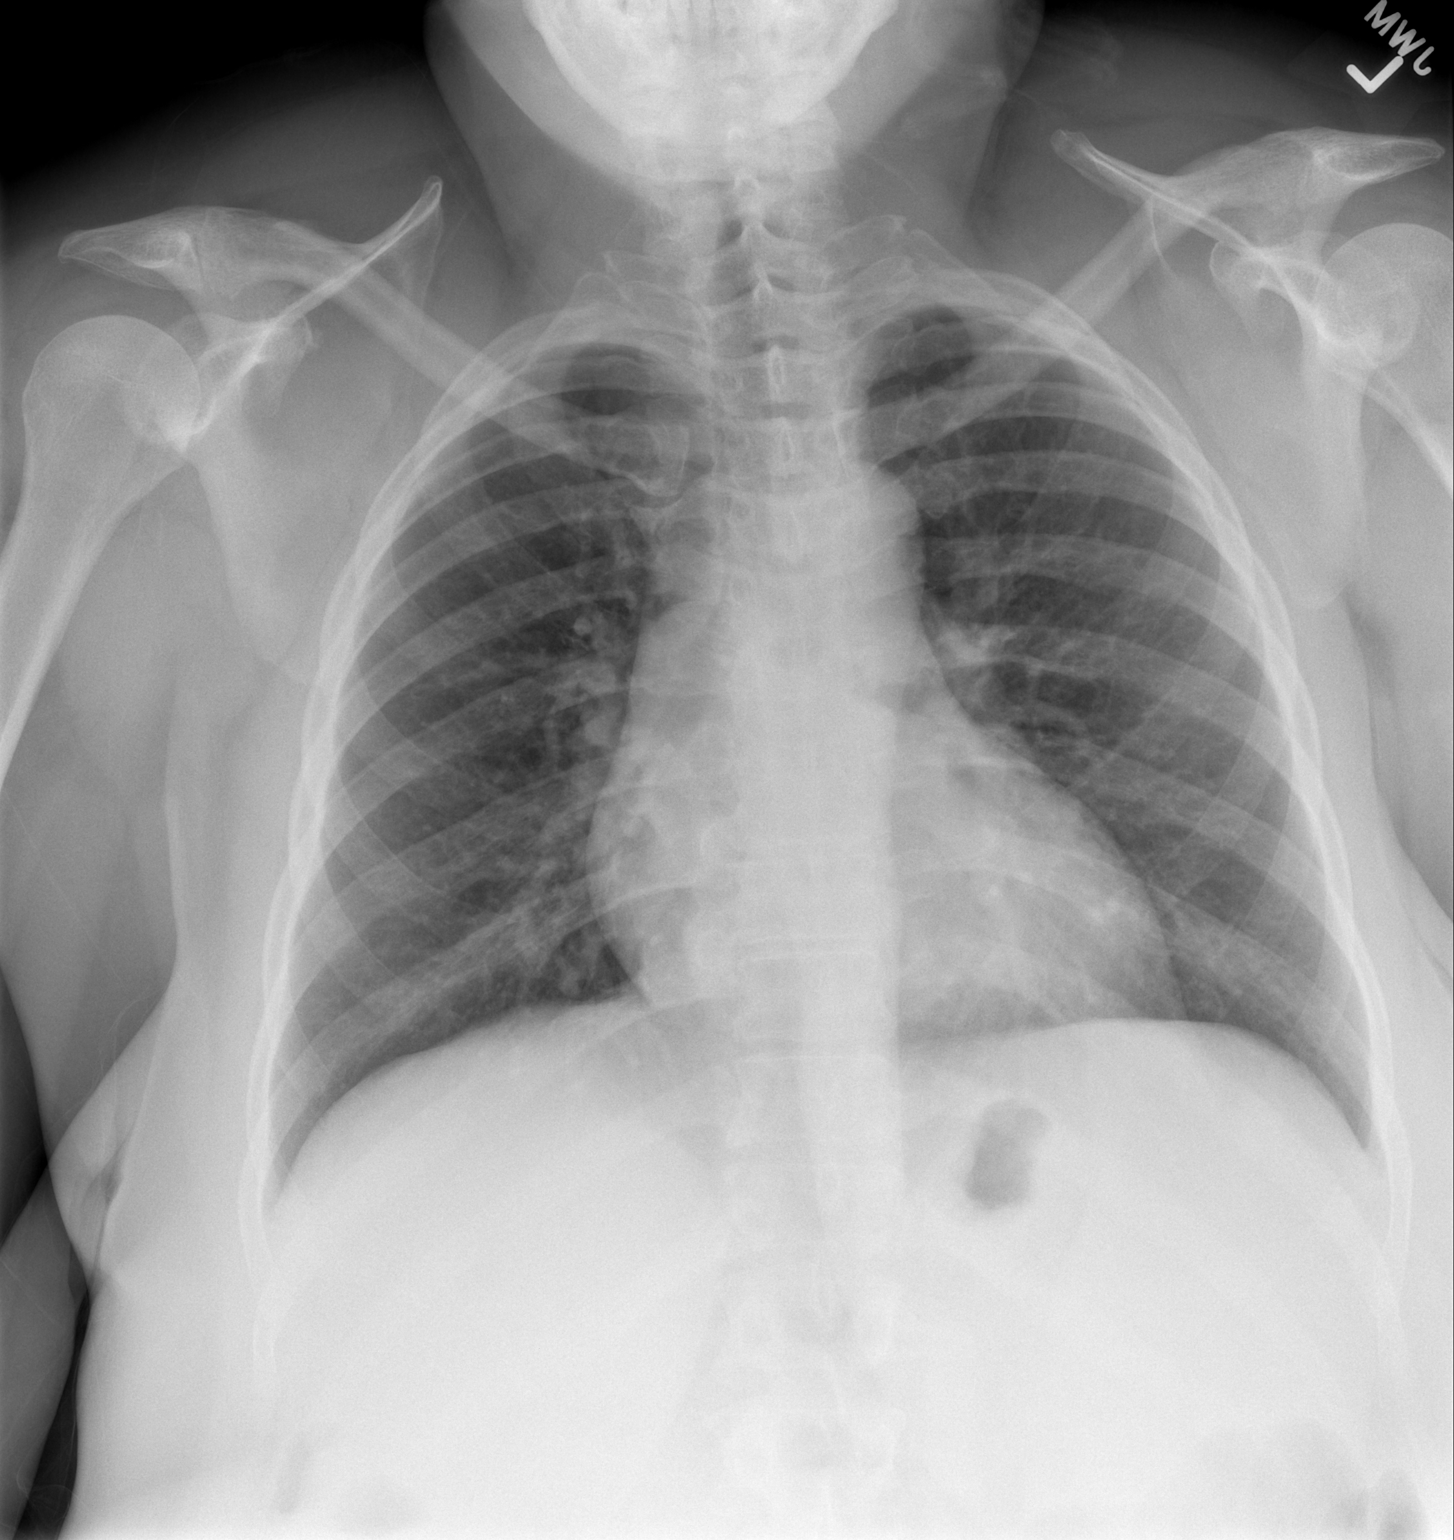

[w chest lat]
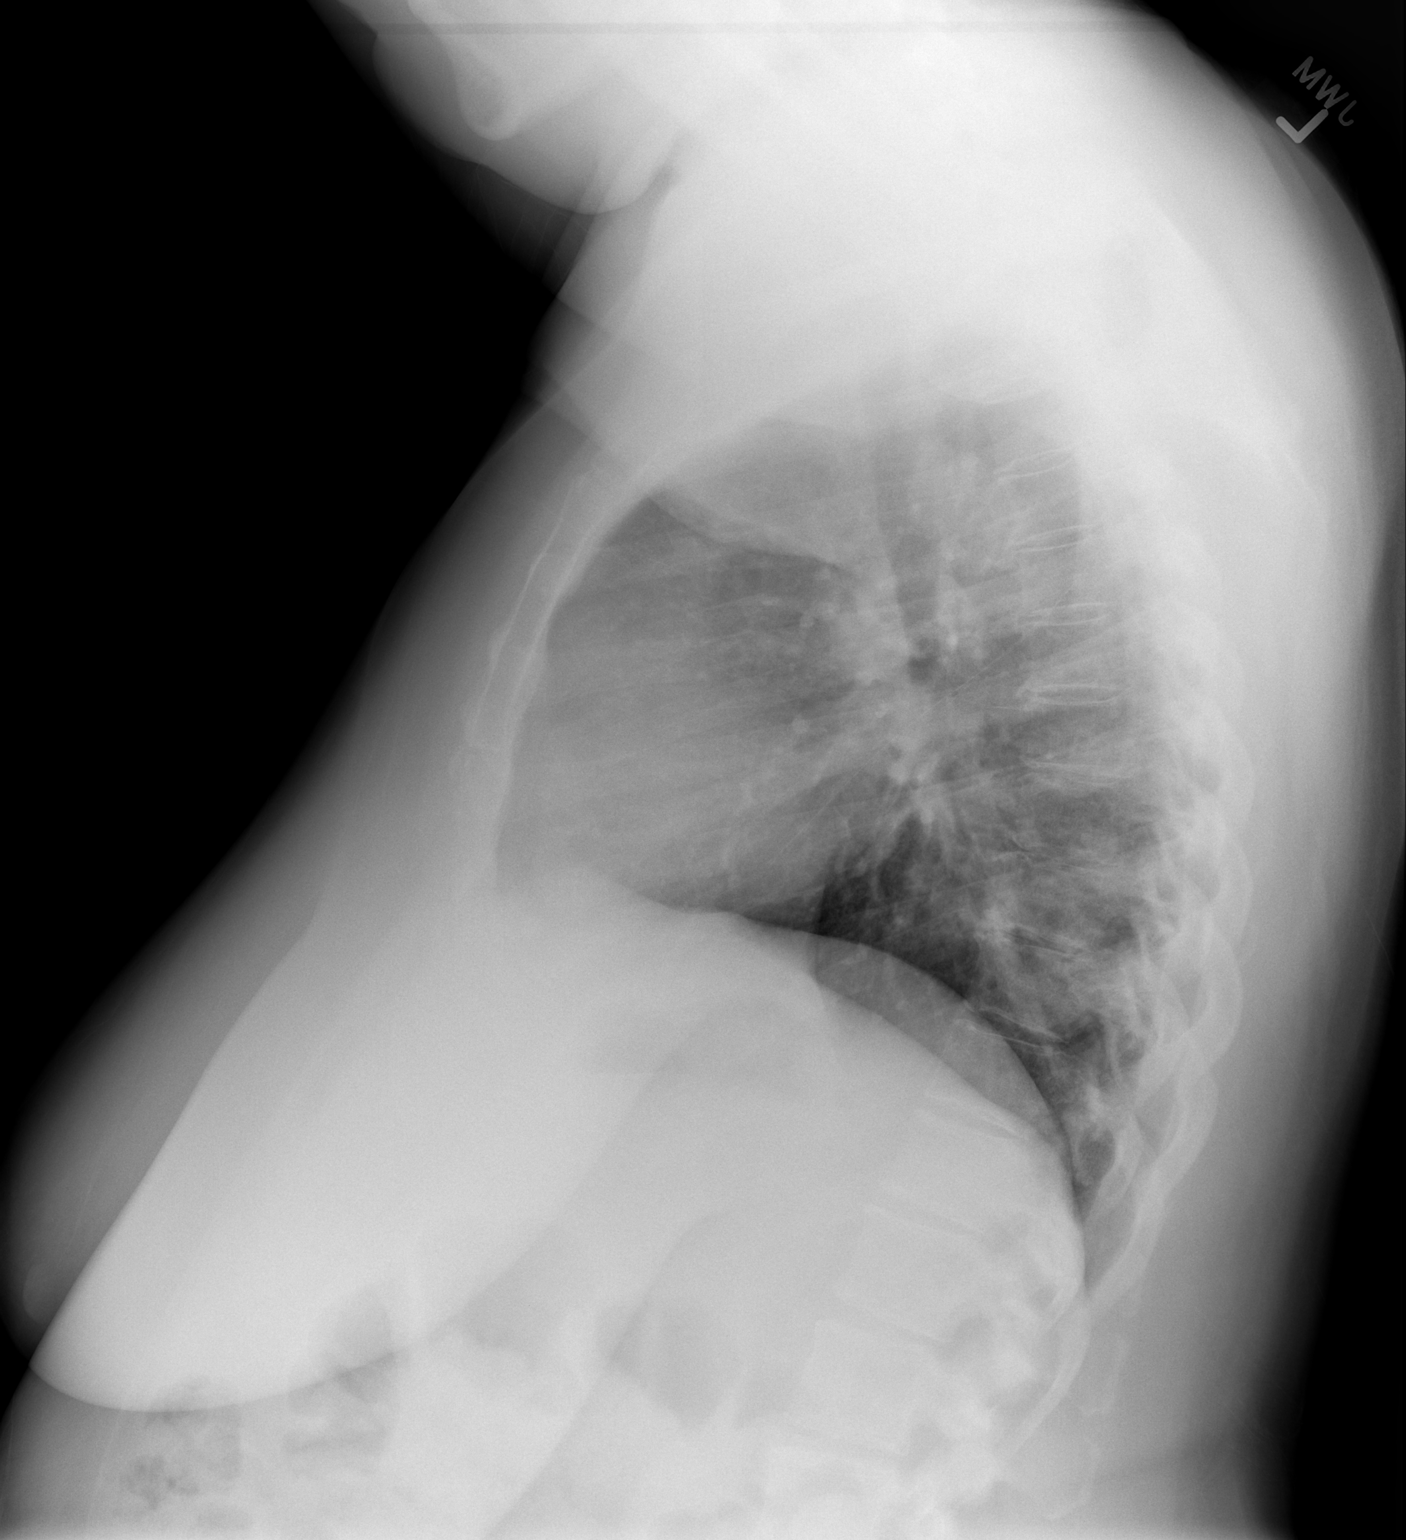

[2 of 2 positions shown; findings below may reference images not displayed]

FINDINGS: Lungs clear.  Cardiopericardial silhouette within normal
limits.  Trachea midline.  No airspace disease or effusion.  Right
greater than left AC joint osteoarthritis.
IMPRESSION: No active cardiopulmonary disease.

## 2013-06-10 ENCOUNTER — Telehealth: Payer: Self-pay

## 2013-07-15 ENCOUNTER — Ambulatory Visit: Payer: Medicare Other

## 2013-07-17 ENCOUNTER — Ambulatory Visit: Payer: Medicare Other

## 2013-08-19 ENCOUNTER — Ambulatory Visit: Payer: Medicare Other | Attending: Internal Medicine | Admitting: Internal Medicine

## 2013-08-19 VITALS — BP 159/83 | HR 119 | Temp 100.0°F | Resp 16 | Ht 62.0 in | Wt 203.0 lb

## 2013-08-19 DIAGNOSIS — E119 Type 2 diabetes mellitus without complications: Secondary | ICD-10-CM | POA: Insufficient documentation

## 2013-08-19 DIAGNOSIS — J019 Acute sinusitis, unspecified: Secondary | ICD-10-CM | POA: Insufficient documentation

## 2013-08-19 LAB — COMPLETE METABOLIC PANEL WITH GFR
AST: 74 U/L — ABNORMAL HIGH (ref 0–37)
Alkaline Phosphatase: 132 U/L — ABNORMAL HIGH (ref 39–117)
BUN: 8 mg/dL (ref 6–23)
CO2: 27 mEq/L (ref 19–32)
Creat: 0.69 mg/dL (ref 0.50–1.10)
Total Bilirubin: 1.7 mg/dL — ABNORMAL HIGH (ref 0.3–1.2)

## 2013-08-19 LAB — CBC WITH DIFFERENTIAL/PLATELET
Basophils Absolute: 0 10*3/uL (ref 0.0–0.1)
Basophils Relative: 0 % (ref 0–1)
Eosinophils Absolute: 0 10*3/uL (ref 0.0–0.7)
Eosinophils Relative: 0 % (ref 0–5)
HCT: 38.4 % (ref 36.0–46.0)
MCH: 28.3 pg (ref 26.0–34.0)
MCHC: 35.4 g/dL (ref 30.0–36.0)
MCV: 79.8 fL (ref 78.0–100.0)
Monocytes Absolute: 1.4 10*3/uL — ABNORMAL HIGH (ref 0.1–1.0)
Monocytes Relative: 16 % — ABNORMAL HIGH (ref 3–12)
Neutro Abs: 5.6 10*3/uL (ref 1.7–7.7)
RDW: 14.3 % (ref 11.5–15.5)
WBC: 8.9 10*3/uL (ref 4.0–10.5)

## 2013-08-19 LAB — POCT GLYCOSYLATED HEMOGLOBIN (HGB A1C): Hemoglobin A1C: 7.1

## 2013-08-19 LAB — POCT URINALYSIS DIPSTICK
Bilirubin, UA: NEGATIVE
Ketones, UA: NEGATIVE
Spec Grav, UA: 1.01
pH, UA: 7

## 2013-08-19 MED ORDER — FLUTICASONE PROPIONATE 50 MCG/ACT NA SUSP: 2.0000 | Freq: Every day | NASAL | Status: AC

## 2013-08-19 MED ORDER — LORATADINE 10 MG PO TABS: 10.0000 mg | ORAL_TABLET | Freq: Every day | ORAL | Status: AC

## 2013-08-19 MED ORDER — AZITHROMYCIN 500 MG PO TABS: 500.0000 mg | ORAL_TABLET | Freq: Every day | ORAL | Status: AC

## 2013-08-19 NOTE — Progress Notes (Signed)
Patient ID: Nancy Warner, female   DOB: Dec 20, 1960, 51 y.o.   MRN: [id number]   CC:  HPI: 52 year old female with a history of diabetes, hypertension who presents today for evaluation of fever 102 at home starting 2 days ago associated with runny nose, watery eyes, muscle aches, denies any sore throat or cough No chest pain or shortness of breath CBG mildly elevated in the 150s at home   Allergies  Allergen Reactions  . Anaprox [Naproxen Sodium] Palpitations   Past Medical History  Diagnosis Date  . Hypertension   . Diabetes mellitus    Current Outpatient Prescriptions on File Prior to Visit  Medication Sig Dispense Refill  . hydrochlorothiazide (HYDRODIURIL) 25 MG tablet Take 1 tablet (25 mg total) by mouth daily.  15 tablet  0  . hydrochlorothiazide (HYDRODIURIL) 25 MG tablet Take 1 tablet (25 mg total) by mouth daily.  30 tablet  11  . lisinopril (PRINIVIL,ZESTRIL) 20 MG tablet Take 1 tablet (20 mg total) by mouth daily.  15 tablet  0  . lisinopril (PRINIVIL,ZESTRIL) 20 MG tablet Take 1 tablet (20 mg total) by mouth daily.  30 tablet  11  . metFORMIN (GLUCOPHAGE) 500 MG tablet Take 1 tablet (500 mg total) by mouth daily.  60 tablet  11  . potassium chloride SA (K-DUR,KLOR-CON) 20 MEQ tablet Take 1/2 twice a day  30 tablet  1   No current facility-administered medications on file prior to visit.   History reviewed. No pertinent family history. History   Social History  . Marital Status: Married    Spouse Name: N/A    Number of Children: N/A  . Years of Education: N/A   Occupational History  . Not on file.   Social History Main Topics  . Smoking status: Never Smoker   . Smokeless tobacco: Never Used  . Alcohol Use: No  . Drug Use: No  . Sexual Activity:    Other Topics Concern  . Not on file   Social History Narrative  . No narrative on file    Review of Systems  Constitutional: Negative for fever, chills, diaphoresis, activity change, appetite change and  positive for fatigue.  HENT: Negative for ear pain, nosebleeds, congestion, facial swelling, positive rhinorrhea, neck pain, neck stiffness and ear discharge.   Eyes: Negative for pain, discharge, redness, itching and visual disturbance.  Respiratory: Negative for cough, choking, chest tightness, shortness of breath, wheezing and stridor.   Cardiovascular: Negative for chest pain, palpitations and leg swelling.  Gastrointestinal: Negative for abdominal distention.  Genitourinary: Negative for dysuria, urgency, frequency, hematuria, flank pain, decreased urine volume, difficulty urinating and dyspareunia.  Musculoskeletal: Negative for back pain, joint swelling, arthralgias and gait problem.  Neurological: Negative for dizziness, tremors, seizures, syncope, facial asymmetry, speech difficulty, weakness, light-headedness, numbness and headaches.  Hematological: Negative for adenopathy. Does not bruise/bleed easily.  Psychiatric/Behavioral: Negative for hallucinations, behavioral problems, confusion, dysphoric mood, decreased concentration and agitation.    Objective:   Filed Vitals:   08/19/13 1437  BP: 159/83  Pulse: 119  Temp: 100 F (37.8 C)  Resp: 16    Physical Exam  Constitutional: Appears well-developed and well-nourished. No distress.  HENT: Normocephalic. External right and left ear normal. Oropharynx is clear and moist.  Eyes: Conjunctivae injection and EOM are normal. PERRLA, no scleral icterus.  Neck: Normal ROM. Neck supple. No JVD. No tracheal deviation. No thyromegaly.  CVS: RRR, S1/S2 +, no murmurs, no gallops, no carotid bruit.  Pulmonary: Effort and  breath sounds normal, no stridor, rhonchi, wheezes, rales.  Abdominal: Soft. BS +,  no distension, tenderness, rebound or guarding.  Musculoskeletal: Normal range of motion. No edema and no tenderness.  Lymphadenopathy: No lymphadenopathy noted, cervical, inguinal. Neuro: Alert. Normal reflexes, muscle tone coordination.  No cranial nerve deficit. Skin: Skin is warm and dry. No rash noted. Not diaphoretic. No erythema. No pallor.  Psychiatric: Normal mood and affect. Behavior, judgment, thought content normal.   Lab Results  Component Value Date   WBC 4.3 04/04/2013   HGB 13.4 04/04/2013   HCT 38.0 04/04/2013   MCV 81.7 04/04/2013   PLT 75* 04/04/2013   Lab Results  Component Value Date   CREATININE 0.66 05/29/2013   BUN 7 05/29/2013   NA 139 05/29/2013   K 3.2* 05/29/2013   CL 102 05/29/2013   CO2 33* 05/29/2013    Lab Results  Component Value Date   HGBA1C 6.6% 04/16/2013   Lipid Panel  No results found for this basename: chol, trig, hdl, cholhdl, vldl, ldlcalc       Assessment and plan:   There are no active problems to display for this patient.  Acute sinusitis likely viral Given history of diabetes we'll prescribe azithromycin 500 mg for 7 days Also start the patient on Claritin and Flonase   Diabetes no change in her regimen at this time   Hypokalemia recheck potassium today is  Follow up in 3 months       The patient was given clear instructions to go to ER or return to medical center if symptoms don't improve, worsen or new problems develop. The patient verbalized understanding. The patient was told to call to get any lab results if not heard anything in the next week.

## 2013-08-19 NOTE — Progress Notes (Signed)
Pt is here today with a C.C. Of feeling sick and having a fever.

## 2013-08-24 ENCOUNTER — Telehealth: Payer: Self-pay | Admitting: *Deleted

## 2013-08-24 MED ORDER — POTASSIUM CHLORIDE CRYS ER 20 MEQ PO TBCR: 40.0000 meq | EXTENDED_RELEASE_TABLET | Freq: Every day | ORAL | Status: DC

## 2013-08-24 NOTE — Telephone Encounter (Signed)
Message copied by Raliegh Scobie, [place] on Mon Aug 24, 2013  9:55 AM ------      Message from: Susie CassetteABROL MD, [facility]      Created: Mon Aug 24, 2013  9:44 AM       Please notify patient that, hemoglobin A1c was 7.1. This reflects moderate control. He is asked patient to increase her potassium, k-dur to 40 meqs daily. Her potassium tablets have been refilled.      Patient also has abnormal liver function tests which are unchanged since September. She has been scheduled for an abdominal ultrasound as well as a GI referral      She has also been scheduled to have labs done for acute hepatitis panel. ------

## 2013-08-24 NOTE — Telephone Encounter (Signed)
Message copied by Bijal Siglin, [place] on Mon Aug 24, 2013 10:22 AM ------      Message from: Susie CassetteABROL MD, [facility]      Created: Mon Aug 24, 2013  9:49 AM       schedule patient for right upper quadrant ultrasound, acute hepatitis panel. ------

## 2013-08-24 NOTE — Progress Notes (Signed)
Order is not showing up under current orders. Unable to schedule appointment.

## 2013-10-12 ENCOUNTER — Encounter: Payer: Self-pay | Admitting: Internal Medicine

## 2013-11-13 ENCOUNTER — Ambulatory Visit: Payer: Medicare Other | Admitting: Internal Medicine

## 2013-11-30 ENCOUNTER — Ambulatory Visit: Payer: Medicare Other

## 2014-01-15 ENCOUNTER — Telehealth: Payer: Self-pay | Admitting: *Deleted

## 2014-01-15 NOTE — Telephone Encounter (Signed)
Cierra from [facility] wanting to know if we could refill patient's potassium medication. Informed Cierra we could not patient has not been seen since 07/2013 and has no future appointment. We need to draw labs. Chyrel Masson stated she will contact patient and let her know to schedule an appointment. Reather Laurence, RN

## 2014-04-07 ENCOUNTER — Other Ambulatory Visit: Payer: Self-pay | Admitting: Internal Medicine

## 2014-04-13 ENCOUNTER — Telehealth: Payer: Self-pay | Admitting: Family Medicine

## 2014-04-13 DIAGNOSIS — I10 Essential (primary) hypertension: Secondary | ICD-10-CM

## 2014-04-13 MED ORDER — LISINOPRIL 20 MG PO TABS: 20.0000 mg | ORAL_TABLET | Freq: Every day | ORAL | Status: DC

## 2014-04-13 MED ORDER — POTASSIUM CHLORIDE CRYS ER 20 MEQ PO TBCR: 40.0000 meq | EXTENDED_RELEASE_TABLET | Freq: Every day | ORAL | Status: AC

## 2014-04-13 NOTE — Telephone Encounter (Signed)
Pt. Calling to request a refill for lisinopril (PRINIVIL,ZESTRIL) 20 MG tablet and  potassium chloride SA (K-DUR,KLOR-CON) 20 MEQ tablet. Pt would like enough medication to last her until her appt on 04/20/2014. Please f/u with pt.

## 2014-04-13 NOTE — Telephone Encounter (Signed)
Confirmed appt with pt, one month Rx lisinopril and Potassium e-scribed to [facility] in [place]

## 2014-04-20 ENCOUNTER — Ambulatory Visit: Payer: Medicare Other | Admitting: Family Medicine

## 2014-04-29 ENCOUNTER — Telehealth: Payer: Self-pay | Admitting: Family Medicine

## 2014-04-29 NOTE — Telephone Encounter (Signed)
Patient has called in today to schedule an appointment with Dr. Armen Pickup patient was unable to keep her last appointment due to transportation issues; patient is unable to come in for a 4:45pm appointment today so she is requesting a medication refill until Monday @ 4:45pm so she can set up transportation; please f/u with patient about this request;

## 2014-04-29 NOTE — Telephone Encounter (Signed)
Patient is coming in on 9/14 and will address then

## 2014-04-30 ENCOUNTER — Telehealth: Payer: Self-pay | Admitting: Emergency Medicine

## 2014-04-30 ENCOUNTER — Other Ambulatory Visit: Payer: Self-pay | Admitting: Emergency Medicine

## 2014-04-30 DIAGNOSIS — I10 Essential (primary) hypertension: Secondary | ICD-10-CM

## 2014-04-30 MED ORDER — LISINOPRIL 20 MG PO TABS: 20.0000 mg | ORAL_TABLET | Freq: Every day | ORAL | Status: DC

## 2014-04-30 NOTE — Telephone Encounter (Signed)
Pt prescribed 15 tablet Lisinopril 20 mg tab until seen by provider 05/03/2014 Medication e-scribed to [facility] [address] pharmacy

## 2014-05-03 ENCOUNTER — Ambulatory Visit: Payer: Medicare Other | Admitting: Family Medicine

## 2014-05-07 ENCOUNTER — Ambulatory Visit: Payer: Medicare HMO | Attending: Family Medicine | Admitting: Family Medicine

## 2014-05-07 ENCOUNTER — Telehealth: Payer: Self-pay | Admitting: Family Medicine

## 2014-05-07 ENCOUNTER — Encounter: Payer: Self-pay | Admitting: Family Medicine

## 2014-05-07 VITALS — BP 160/92 | HR 109 | Temp 99.2°F | Resp 18 | Ht 62.0 in | Wt 198.0 lb

## 2014-05-07 DIAGNOSIS — R350 Frequency of micturition: Secondary | ICD-10-CM | POA: Insufficient documentation

## 2014-05-07 DIAGNOSIS — Z23 Encounter for immunization: Secondary | ICD-10-CM

## 2014-05-07 DIAGNOSIS — E119 Type 2 diabetes mellitus without complications: Secondary | ICD-10-CM | POA: Insufficient documentation

## 2014-05-07 DIAGNOSIS — I10 Essential (primary) hypertension: Secondary | ICD-10-CM | POA: Insufficient documentation

## 2014-05-07 LAB — POCT GLYCOSYLATED HEMOGLOBIN (HGB A1C): Hemoglobin A1C: 6.5

## 2014-05-07 LAB — COMPLETE METABOLIC PANEL WITH GFR
ALK PHOS: 198 U/L — AB (ref 39–117)
ALT: 64 U/L — ABNORMAL HIGH (ref 0–35)
AST: 102 U/L — ABNORMAL HIGH (ref 0–37)
Albumin: 3.5 g/dL (ref 3.5–5.2)
BUN: 8 mg/dL (ref 6–23)
CO2: 28 meq/L (ref 19–32)
CREATININE: 0.67 mg/dL (ref 0.50–1.10)
Calcium: 9.4 mg/dL (ref 8.4–10.5)
Chloride: 100 mEq/L (ref 96–112)
GFR, Est Non African American: >89 mL/min
Glucose, Bld: 107 mg/dL — ABNORMAL HIGH (ref 70–99)
Potassium: 3.2 mEq/L — ABNORMAL LOW (ref 3.5–5.3)
Sodium: 139 mEq/L (ref 135–145)
Total Bilirubin: 1.2 mg/dL (ref 0.2–1.2)
Total Protein: 8.4 g/dL — ABNORMAL HIGH (ref 6.0–8.3)

## 2014-05-07 LAB — LIPID PANEL
CHOLESTEROL: 147 mg/dL (ref 0–200)
HDL: 56 mg/dL (ref >39)
LDL CALC: 76 mg/dL (ref 0–99)
TRIGLYCERIDES: 76 mg/dL (ref <150)
Total CHOL/HDL Ratio: 2.6 Ratio
VLDL: 15 mg/dL (ref 0–40)

## 2014-05-07 LAB — POCT URINALYSIS DIPSTICK

## 2014-05-07 MED ORDER — LISINOPRIL 40 MG PO TABS: 40.0000 mg | ORAL_TABLET | Freq: Every day | ORAL | Status: AC

## 2014-05-07 MED ORDER — HYDROCHLOROTHIAZIDE 25 MG PO TABS: 25.0000 mg | ORAL_TABLET | Freq: Every day | ORAL | Status: AC

## 2014-05-07 MED ORDER — METFORMIN HCL ER 500 MG PO TB24: 1000.0000 mg | ORAL_TABLET | Freq: Every day | ORAL | Status: AC

## 2014-05-07 NOTE — Assessment & Plan Note (Signed)
A: near goal, elevated SBP Meds: compliant P: Refill HCTZ 25 Increase lisinopril from 20 to 40 mg daily F/u in 6 months

## 2014-05-07 NOTE — Assessment & Plan Note (Signed)
A: well controlled Meds: compliant P; Lipids Foot exam done today, low risk Referral to ophthalmology Metformin changed to XR and increase to 1000 mg to help with weight loss goals F/u in 6 months

## 2014-05-07 NOTE — Telephone Encounter (Signed)
Pt called stating that she gave the [facility] information for her eye doctor location. The CORRECT information is [facility] located at [address] in [place] and their number is [phone].

## 2014-05-07 NOTE — Telephone Encounter (Signed)
Pt. Calling to give her PCP her eye doctor information, Dr. Porfirio Mylar at [facility] : [phone]..[address] Rondall Allegra, [place].

## 2014-05-07 NOTE — Assessment & Plan Note (Signed)
A: w/o dysuria and well controlled blood sugars. UA with small blood and Leukocytes. P: F/u urine culture

## 2014-05-07 NOTE — Progress Notes (Signed)
   Subjective:    Patient ID: Nancy Warner, female    DOB: [birth year], 53 y.o.   MRN: [id number] CC: f/u HTN and DM2 HPI  1. CHRONIC DIABETES  Disease Monitoring  Blood Sugar Ranges: fasting 125- 166  Polyuria: yes, no dysuria   Visual problems: no   Medication Compliance: yes  Medication Side Effects  Hypoglycemia: no   Preventitive Health Care  Eye Exam: due to go back to [facility]   Foot Exam: done today  Diet pattern: 3 meals, nighttime snack   Exercise: regular, 3 x per week   2. CHRONIC HYPERTENSION  Disease Monitoring  Blood pressure range: 130-150/80-90   Chest pain: no   Dyspnea: no   Claudication: no   Medication compliance: yes  Medication Side Effects  Lightheadedness: no   Urinary frequency: yes   Edema: no   Impotence: no   Soc hx: non smoker   Review of Systems As per HPI     Objective:   Physical Exam BP 160/92  Pulse 109  Temp(Src) 99.2 F (37.3 C) (Oral)  Resp 18  Ht  (1.575 m)  Wt 198 lb (89.812 kg)  BMI 36.21 kg/m2  SpO2 98% General appearance: alert, cooperative and no distress Eyes: conjunctivae/corneas clear. PERRL, EOM's intact.  Lungs: clear to auscultation bilaterally Heart: regular rate and rhythm, S1, S2 normal, no murmur, click, rub or gallop Extremities: edema trace b/l        Assessment & Plan:

## 2014-05-07 NOTE — Progress Notes (Signed)
F/U DM AND HTN

## 2014-05-07 NOTE — Patient Instructions (Signed)
Nancy Warner,  Thank you for coming back today. Excellent diabetes control! Continue metformin increase to 1000 mg daily, change to XR to help with weight loss. Referral to ophthalmology placed.   For high blood pressure continue HCTZ and lisinopril, increase to 40 mg daily.   F/u in 6 months for DM2 and HTN Separate  F/u physical recommended, we can do you pap smear then.   I will call with lab results.   Dr. Armen Pickup

## 2014-05-09 LAB — URINE CULTURE: Colony Count: 5000

## 2014-05-10 ENCOUNTER — Telehealth: Payer: Self-pay | Admitting: Family Medicine

## 2014-05-10 DIAGNOSIS — J309 Allergic rhinitis, unspecified: Secondary | ICD-10-CM | POA: Insufficient documentation

## 2014-05-10 DIAGNOSIS — E876 Hypokalemia: Secondary | ICD-10-CM | POA: Insufficient documentation

## 2014-05-10 DIAGNOSIS — R748 Abnormal levels of other serum enzymes: Secondary | ICD-10-CM | POA: Insufficient documentation

## 2014-05-10 DIAGNOSIS — R945 Abnormal results of liver function studies: Secondary | ICD-10-CM

## 2014-05-10 DIAGNOSIS — R350 Frequency of micturition: Secondary | ICD-10-CM

## 2014-05-10 DIAGNOSIS — R7989 Other specified abnormal findings of blood chemistry: Secondary | ICD-10-CM

## 2014-05-10 NOTE — Assessment & Plan Note (Signed)
Moderate elevation of alk phos Plan for f/u fasting LFTs and GGT

## 2014-05-10 NOTE — Telephone Encounter (Signed)
Noted, info routed to referral coordinator

## 2014-05-10 NOTE — Telephone Encounter (Signed)
Pt called with correct Eye doctor-   Pt called stating that she gave the [facility] information for her eye doctor location. The CORRECT information is [facility] located at [address] in [place] and their number is [phone].

## 2014-05-10 NOTE — Assessment & Plan Note (Signed)
Negative urine culture

## 2014-05-10 NOTE — Telephone Encounter (Signed)
Called patient Urine culture negative  10 year CVD risk is 15.3 % high intensity statin recommended, reluctant to start statin in the setting of elevated liver function test, will investigate more.  Persistently low potassium, need f/u Mag. Elevated liver enzymes, AST about 2x ALT, elevated alk phos. Need f/u fasting LFTs and GGT  Need f/u appt with me in next 2-3 weeks

## 2014-06-30 ENCOUNTER — Encounter (HOSPITAL_BASED_OUTPATIENT_CLINIC_OR_DEPARTMENT_OTHER): Payer: Self-pay

## 2014-06-30 ENCOUNTER — Emergency Department (HOSPITAL_BASED_OUTPATIENT_CLINIC_OR_DEPARTMENT_OTHER)
Admission: EM | Admit: 2014-06-30 | Discharge: 2014-06-30 | Disposition: A | Discharge status: Home / Self Care | Payer: Medicare HMO | Attending: Emergency Medicine | Admitting: Emergency Medicine

## 2014-06-30 DIAGNOSIS — E119 Type 2 diabetes mellitus without complications: Secondary | ICD-10-CM | POA: Diagnosis not present

## 2014-06-30 DIAGNOSIS — X58XXXA Exposure to other specified factors, initial encounter: Secondary | ICD-10-CM | POA: Insufficient documentation

## 2014-06-30 DIAGNOSIS — S0993XA Unspecified injury of face, initial encounter: Secondary | ICD-10-CM | POA: Diagnosis present

## 2014-06-30 DIAGNOSIS — Z7951 Long term (current) use of inhaled steroids: Secondary | ICD-10-CM | POA: Insufficient documentation

## 2014-06-30 DIAGNOSIS — Z79899 Other long term (current) drug therapy: Secondary | ICD-10-CM | POA: Insufficient documentation

## 2014-06-30 DIAGNOSIS — Y9289 Other specified places as the place of occurrence of the external cause: Secondary | ICD-10-CM | POA: Insufficient documentation

## 2014-06-30 DIAGNOSIS — K1379 Other lesions of oral mucosa: Secondary | ICD-10-CM | POA: Insufficient documentation

## 2014-06-30 DIAGNOSIS — Y998 Other external cause status: Secondary | ICD-10-CM | POA: Diagnosis not present

## 2014-06-30 DIAGNOSIS — I10 Essential (primary) hypertension: Secondary | ICD-10-CM | POA: Insufficient documentation

## 2014-06-30 DIAGNOSIS — K1370 Unspecified lesions of oral mucosa: Secondary | ICD-10-CM

## 2014-06-30 DIAGNOSIS — Y9389 Activity, other specified: Secondary | ICD-10-CM | POA: Diagnosis not present

## 2014-06-30 HISTORY — DX: Hypokalemia: E87.6

## 2014-06-30 MED ORDER — CHLORHEXIDINE GLUCONATE 0.12 % MT SOLN: 15.0000 mL | Freq: Two times a day (BID) | OROMUCOSAL | Status: AC

## 2014-06-30 NOTE — ED Notes (Signed)
Pt states she bit the left inner cheek 3 days ago

## 2014-06-30 NOTE — Discharge Instructions (Signed)
As discussed, your evaluation today has been largely reassuring.  But, it is important that you monitor your condition carefully, and do not hesitate to return to the ED if you develop new, or concerning changes in your condition.  The blood blister in your mouth will pop at some point in the next few days.  Otherwise, please follow-up with your physician for appropriate ongoing care.

## 2014-06-30 NOTE — ED Provider Notes (Signed)
CSN: [id number]     Arrival date & time 06/30/14  1803 History  This chart was scribed for Gerhard Munchobert Alzada Brazee, M.D. by Ronney LionSuzanne Le, ED Scribe. This patient was seen in room MHOTF/OTF and the patient's care was started at 7:10 PM.    Chief Complaint  Patient presents with  . Mouth Injury   HPI  HPI Comments: Regis Billrena F Ringley is a 53 y.o. female who presents to the Emergency Department complaining of an irritating lump in her inner left cheek that occurred when she bit her mouth 2 days ago. She notes that the lump is not painful. Patient currently has a broken tooth on the right side of the mouth. She denies, fever, chills, nausea, and vomiting. She also denies chest pain, dyspnea, and syncope.   Past Medical History  Diagnosis Date  . Diabetes mellitus 2000  . Hypertension 2000  . Hypokalemia    Past Surgical History  Procedure Laterality Date  . Tubal ligation    . Appendectomy    . Cesarean section    . Abdominal hysterectomy     No family history on file. History  Substance Use Topics  . Smoking status: Never Smoker   . Smokeless tobacco: Never Used  . Alcohol Use: No   OB History    No data available     Review of Systems  All other systems reviewed and are negative.     Allergies  Anaprox  Home Medications   Prior to Admission medications   Medication Sig Start Date End Date Taking? Authorizing Provider  fluticasone (FLONASE) 50 MCG/ACT nasal spray Place 2 sprays into both nostrils daily. 08/19/13   Richarda OverlieNayana Abrol, MD  hydrochlorothiazide (HYDRODIURIL) 25 MG tablet Take 1 tablet (25 mg total) by mouth daily. 05/07/14   Josalyn C Funches, MD  lisinopril (PRINIVIL,ZESTRIL) 40 MG tablet Take 1 tablet (40 mg total) by mouth daily. 05/07/14   Josalyn C Funches, MD  loratadine (CLARITIN) 10 MG tablet Take 1 tablet (10 mg total) by mouth daily. 08/19/13   Richarda OverlieNayana Abrol, MD  metFORMIN (GLUCOPHAGE XR) 500 MG 24 hr tablet Take 2 tablets (1,000 mg total) by mouth daily with  breakfast. 05/07/14   Josalyn C Funches, MD  potassium chloride SA (K-DUR,KLOR-CON) 20 MEQ tablet Take 2 tablets (40 mEq total) by mouth daily. 04/13/14   Josalyn C Funches, MD   BP 169/83 mmHg  Pulse 105  Temp(Src) 98.9 F (37.2 C) (Oral)  Resp 16  Ht 5\' 4"  (1.626 m)  Wt 197 lb (89.359 kg)  BMI 33.80 kg/m2  SpO2 100% Physical Exam  Constitutional: She is oriented to person, place, and time. She appears well-developed and well-nourished. No distress.  HENT:  Head: Normocephalic and atraumatic.  Mouth/Throat:    Eyes: Conjunctivae and EOM are normal.  Cardiovascular: Normal rate and regular rhythm.   Pulmonary/Chest: Effort normal and breath sounds normal. No stridor. No respiratory distress.  Abdominal: She exhibits no distension.  Musculoskeletal: She exhibits no edema.  Neurological: She is alert and oriented to person, place, and time. No cranial nerve deficit.  Skin: Skin is warm and dry.  Psychiatric: She has a normal mood and affect.  Nursing note and vitals reviewed.   ED Course  Procedures (including critical care time)  DIAGNOSTIC STUDIES: Oxygen Saturation is 100% on room air, normal by my interpretation.    COORDINATION OF CARE: 8:30 PM-Discussed treatment plan with pt at bedside and pt agreed to plan.       MDM  Final diagnoses:  Mouth lesion  Patient presents with a new left infraorbital oral lesion.  Lesions consistent with blood blister, and given the patient's history of biting down prior to the onset, there is low suspicion for occult malignancy or infection.  Patient was counseled on return precautions, provided oral antibiotics,discharged to follow-up with primary care  I personally performed the services described in this documentation, which was scribed in my presence. The recorded information has been reviewed and is accurate.      Gerhard Munchobert Miah Boye, MD 06/30/14 2136

## 2015-02-18 DEATH — deceased
